# Patient Record
Sex: Female | Born: 1945 | Race: White | Hispanic: No | Marital: Married | Smoking: Never smoker
Health system: Southern US, Community
[De-identification: ages and names within clinical notes are randomized; demographics above are authoritative.]

## PROBLEM LIST (undated history)

## (undated) DIAGNOSIS — E119 Type 2 diabetes mellitus without complications: Secondary | ICD-10-CM

## (undated) DIAGNOSIS — J45909 Unspecified asthma, uncomplicated: Secondary | ICD-10-CM

## (undated) DIAGNOSIS — I10 Essential (primary) hypertension: Secondary | ICD-10-CM

## (undated) DIAGNOSIS — J449 Chronic obstructive pulmonary disease, unspecified: Secondary | ICD-10-CM

---

## 2010-07-17 HISTORY — PX: AORTIC VALVE REPLACEMENT (AVR)/CORONARY ARTERY BYPASS GRAFTING (CABG): SHX5725

## 2014-08-06 ENCOUNTER — Inpatient Hospital Stay (HOSPITAL_COMMUNITY)
Admission: EM | Admit: 2014-08-06 | Discharge: 2014-08-08 | DRG: 312 | Disposition: A | Discharge status: Home / Self Care | Payer: Medicare Other | Attending: Internal Medicine | Admitting: Internal Medicine

## 2014-08-06 ENCOUNTER — Emergency Department (HOSPITAL_COMMUNITY): Payer: Medicare Other

## 2014-08-06 ENCOUNTER — Encounter (HOSPITAL_COMMUNITY): Payer: Self-pay

## 2014-08-06 DIAGNOSIS — Z882 Allergy status to sulfonamides status: Secondary | ICD-10-CM | POA: Diagnosis not present

## 2014-08-06 DIAGNOSIS — Z953 Presence of xenogenic heart valve: Secondary | ICD-10-CM

## 2014-08-06 DIAGNOSIS — I10 Essential (primary) hypertension: Secondary | ICD-10-CM | POA: Diagnosis present

## 2014-08-06 DIAGNOSIS — R001 Bradycardia, unspecified: Secondary | ICD-10-CM | POA: Diagnosis present

## 2014-08-06 DIAGNOSIS — J449 Chronic obstructive pulmonary disease, unspecified: Secondary | ICD-10-CM | POA: Diagnosis present

## 2014-08-06 DIAGNOSIS — R079 Chest pain, unspecified: Secondary | ICD-10-CM

## 2014-08-06 DIAGNOSIS — Z951 Presence of aortocoronary bypass graft: Secondary | ICD-10-CM

## 2014-08-06 DIAGNOSIS — R7989 Other specified abnormal findings of blood chemistry: Secondary | ICD-10-CM | POA: Diagnosis present

## 2014-08-06 DIAGNOSIS — Z881 Allergy status to other antibiotic agents status: Secondary | ICD-10-CM | POA: Diagnosis not present

## 2014-08-06 DIAGNOSIS — J45909 Unspecified asthma, uncomplicated: Secondary | ICD-10-CM | POA: Diagnosis present

## 2014-08-06 DIAGNOSIS — E119 Type 2 diabetes mellitus without complications: Secondary | ICD-10-CM | POA: Diagnosis present

## 2014-08-06 DIAGNOSIS — E039 Hypothyroidism, unspecified: Secondary | ICD-10-CM | POA: Diagnosis present

## 2014-08-06 DIAGNOSIS — R55 Syncope and collapse: Secondary | ICD-10-CM | POA: Diagnosis present

## 2014-08-06 DIAGNOSIS — Z954 Presence of other heart-valve replacement: Secondary | ICD-10-CM | POA: Diagnosis not present

## 2014-08-06 DIAGNOSIS — Z952 Presence of prosthetic heart valve: Secondary | ICD-10-CM

## 2014-08-06 HISTORY — DX: Chronic obstructive pulmonary disease, unspecified: J44.9

## 2014-08-06 HISTORY — DX: Type 2 diabetes mellitus without complications: E11.9

## 2014-08-06 HISTORY — DX: Unspecified asthma, uncomplicated: J45.909

## 2014-08-06 HISTORY — DX: Essential (primary) hypertension: I10

## 2014-08-06 LAB — CBC WITH DIFFERENTIAL/PLATELET
BASOS ABS: 0 10*3/uL (ref 0.0–0.1)
Basophils Relative: 0 % (ref 0–1)
EOS PCT: 1 % (ref 0–5)
Eosinophils Absolute: 0.1 10*3/uL (ref 0.0–0.7)
HCT: 37.8 % (ref 36.0–46.0)
HEMOGLOBIN: 12.7 g/dL (ref 12.0–15.0)
LYMPHS ABS: 1.2 10*3/uL (ref 0.7–4.0)
Lymphocytes Relative: 20 % (ref 12–46)
MCH: 31.5 pg (ref 26.0–34.0)
MCHC: 33.6 g/dL (ref 30.0–36.0)
MCV: 93.8 fL (ref 78.0–100.0)
MONOS PCT: 8 % (ref 3–12)
Monocytes Absolute: 0.5 10*3/uL (ref 0.1–1.0)
NEUTROS ABS: 4.2 10*3/uL (ref 1.7–7.7)
Neutrophils Relative %: 71 % (ref 43–77)
Platelets: 132 10*3/uL — ABNORMAL LOW (ref 150–400)
RBC: 4.03 MIL/uL (ref 3.87–5.11)
RDW: 13.4 % (ref 11.5–15.5)
WBC: 5.9 10*3/uL (ref 4.0–10.5)

## 2014-08-06 LAB — BASIC METABOLIC PANEL
ANION GAP: 10 (ref 5–15)
BUN: 22 mg/dL (ref 6–23)
CALCIUM: 8.9 mg/dL (ref 8.4–10.5)
CHLORIDE: 108 meq/L (ref 96–112)
CO2: 23 mmol/L (ref 19–32)
CREATININE: 0.96 mg/dL (ref 0.50–1.10)
GFR calc non Af Amer: 60 mL/min — ABNORMAL LOW (ref >90)
GFR, EST AFRICAN AMERICAN: 69 mL/min — AB (ref >90)
GLUCOSE: 158 mg/dL — AB (ref 70–99)
Potassium: 4.3 mmol/L (ref 3.5–5.1)
Sodium: 141 mmol/L (ref 135–145)

## 2014-08-06 LAB — TROPONIN I: TROPONIN I: 0.04 ng/mL — AB (ref <0.031)

## 2014-08-06 MED ORDER — BUPROPION HCL ER (SR) 150 MG PO TB12
150.0000 mg | ORAL_TABLET | Freq: Every day | ORAL | Status: DC
  Administered 2014-08-07 – 2014-08-08 (×2): 150 mg via ORAL
  Filled 2014-08-06 (×3): qty 1

## 2014-08-06 MED ORDER — BUDESONIDE-FORMOTEROL FUMARATE 80-4.5 MCG/ACT IN AERO
2.0000 | INHALATION_SPRAY | Freq: Every day | RESPIRATORY_TRACT | Status: DC
  Administered 2014-08-07 – 2014-08-08 (×2): 2 via RESPIRATORY_TRACT
  Filled 2014-08-06: qty 6.9

## 2014-08-06 MED ORDER — LEVOTHYROXINE SODIUM 112 MCG PO TABS
112.0000 ug | ORAL_TABLET | Freq: Every day | ORAL | Status: DC
  Administered 2014-08-07 – 2014-08-08 (×2): 112 ug via ORAL
  Filled 2014-08-06 (×3): qty 1

## 2014-08-06 MED ORDER — SODIUM CHLORIDE 0.9 % IJ SOLN
3.0000 mL | Freq: Two times a day (BID) | INTRAMUSCULAR | Status: DC
  Administered 2014-08-06 – 2014-08-08 (×4): 3 mL via INTRAVENOUS

## 2014-08-06 MED ORDER — IPRATROPIUM-ALBUTEROL 0.5-2.5 (3) MG/3ML IN SOLN: 3.0000 mL | RESPIRATORY_TRACT | Status: DC | PRN

## 2014-08-06 MED ORDER — METFORMIN HCL 500 MG PO TABS
500.0000 mg | ORAL_TABLET | Freq: Two times a day (BID) | ORAL | Status: DC
  Administered 2014-08-07 (×2): 500 mg via ORAL
  Filled 2014-08-06 (×4): qty 1

## 2014-08-06 MED ORDER — SODIUM CHLORIDE 0.9 % IV SOLN
INTRAVENOUS | Status: DC
  Administered 2014-08-06 – 2014-08-07 (×3): via INTRAVENOUS

## 2014-08-06 MED ORDER — HEPARIN SODIUM (PORCINE) 5000 UNIT/ML IJ SOLN
5000.0000 [IU] | Freq: Three times a day (TID) | INTRAMUSCULAR | Status: DC
  Administered 2014-08-07 – 2014-08-08 (×5): 5000 [IU] via SUBCUTANEOUS
  Filled 2014-08-06 (×8): qty 1

## 2014-08-06 MED ORDER — POTASSIUM CHLORIDE CRYS ER 20 MEQ PO TBCR: 20.0000 meq | EXTENDED_RELEASE_TABLET | ORAL | Status: DC

## 2014-08-06 MED ORDER — FUROSEMIDE 40 MG PO TABS
40.0000 mg | ORAL_TABLET | Freq: Every day | ORAL | Status: DC
  Administered 2014-08-07 – 2014-08-08 (×2): 40 mg via ORAL
  Filled 2014-08-06 (×2): qty 1

## 2014-08-06 MED ORDER — MONTELUKAST SODIUM 10 MG PO TABS
10.0000 mg | ORAL_TABLET | Freq: Every day | ORAL | Status: DC
  Administered 2014-08-07 (×2): 10 mg via ORAL
  Filled 2014-08-06 (×3): qty 1

## 2014-08-06 MED ORDER — SIMVASTATIN 10 MG PO TABS
10.0000 mg | ORAL_TABLET | Freq: Every day | ORAL | Status: DC
  Administered 2014-08-07 (×2): 10 mg via ORAL
  Filled 2014-08-06 (×3): qty 1

## 2014-08-06 MED ORDER — PANTOPRAZOLE SODIUM 40 MG PO TBEC
40.0000 mg | DELAYED_RELEASE_TABLET | Freq: Every day | ORAL | Status: DC
  Administered 2014-08-07 – 2014-08-08 (×2): 40 mg via ORAL
  Filled 2014-08-06 (×2): qty 1

## 2014-08-06 MED ORDER — ASPIRIN 81 MG PO CHEW
324.0000 mg | CHEWABLE_TABLET | Freq: Once | ORAL | Status: AC
  Administered 2014-08-06: 243 mg via ORAL
  Filled 2014-08-06: qty 4

## 2014-08-06 NOTE — ED Provider Notes (Signed)
CSN: [id number]     Arrival date & time 08/06/14  1959 History   First MD Initiated Contact with Patient 08/06/14 2007     Chief Complaint  Patient presents with  . Weakness     (Consider location/radiation/quality/duration/timing/severity/associated sxs/prior Treatment) HPI Comments: Patient here with acute onset of becoming dizzy without dyspnea while riding in a car today. Symptoms are worse when standing up. After she moved her bowels she had sudden onset of substernal chest pain with associated diaphoresis. Patient had a near syncopal event. EMS was called and patient had a rhythm strip with a heart rate of 34. Patient is also hypertensive. Was given IV fluids 400 cc and she feels better at this time. CBG was 188. Denies any recent bloody stools or abdominal pain. Patient took her normal dose of her beta blocker about 4:00 today. She missed her dose yesterday.  Patient is a 69 y.o. female presenting with weakness. The history is provided by the patient and a relative.  Weakness    Past Medical History  Diagnosis Date  . Hypertension   . Asthma   . COPD (chronic obstructive pulmonary disease)   . Diabetes mellitus without complication    No past surgical history on file. No family history on file. History  Substance Use Topics  . Smoking status: Not on file  . Smokeless tobacco: Not on file  . Alcohol Use: Not on file   OB History    No data available     Review of Systems  Neurological: Positive for weakness.  All other systems reviewed and are negative.     Allergies  Biaxin; Demerol; and Sulfa antibiotics  Home Medications   Prior to Admission medications   Not on File   There were no vitals taken for this visit. Physical Exam  Constitutional: She is oriented to person, place, and time. She appears well-developed and well-nourished.  Non-toxic appearance. No distress.  HENT:  Head: Normocephalic and atraumatic.  Eyes: Conjunctivae, EOM and lids are  normal. Pupils are equal, round, and reactive to light.  Neck: Normal range of motion. Neck supple. No tracheal deviation present. No thyroid mass present.  Cardiovascular: Normal rate, regular rhythm and normal heart sounds.  Exam reveals no gallop.   No murmur heard. Pulmonary/Chest: Effort normal and breath sounds normal. No stridor. No respiratory distress. She has no decreased breath sounds. She has no wheezes. She has no rhonchi. She has no rales.  Abdominal: Soft. Normal appearance and bowel sounds are normal. She exhibits no distension. There is no tenderness. There is no rebound and no CVA tenderness.  Musculoskeletal: Normal range of motion. She exhibits no edema or tenderness.  Neurological: She is alert and oriented to person, place, and time. She has normal strength. No cranial nerve deficit or sensory deficit. GCS eye subscore is 4. GCS verbal subscore is 5. GCS motor subscore is 6.  Skin: Skin is warm and dry. No abrasion and no rash noted.  Psychiatric: She has a normal mood and affect. Her speech is normal and behavior is normal.  Nursing note and vitals reviewed.   ED Course  Procedures (including critical care time) Labs Review Labs Reviewed  TROPONIN I  CBC WITH DIFFERENTIAL  BASIC METABOLIC PANEL    Imaging Review No results found.   EKG Interpretation   Date/Time:  Thursday August 06 2014 20:10:39 EST Ventricular Rate:  63 PR Interval:  240 QRS Duration: 109 QT Interval:  401 QTC Calculation: 410 R Axis:   -  28 Text Interpretation:  Sinus rhythm Atrial premature complex Prolonged PR  interval Borderline left axis deviation Borderline repolarization  abnormality Confirmed by Freida BusmanALLEN  MD, Wilder Amodei ([id number]) on 08/06/2014 8:26:51  PM      MDM   Final diagnoses:  Chest pain   Patient given aspirin on arrival here. She has no chest pain currently at this time. EKG was repeated and she continues to have some ST depression in her inferior leads. Will consult  cardiology due to slightly elevated troponin.   Consult to internal medicine medicine for admission. Also made     Toy BakerAnthony T Tomasa Dobransky, MD 08/06/14 2240

## 2014-08-06 NOTE — ED Notes (Signed)
Pt remains monitored by blood pressure, pulse ox, and 12 lead. pts family remains at bedside.  

## 2014-08-06 NOTE — ED Notes (Signed)
Pt noticed weakness and dizziness throughout the day while riding in the car. Pt does not have hx of irregular heartbeat. Pt has had intermittent chest pain throughout the day with no aggravating/relieving factors.

## 2014-08-06 NOTE — H&P (Signed)
[facility] History and Physical  Denise Gray WUJ:[id number] DOB: [birth year] DOA: 08/06/2014  Referring physician: EDP PCP: No primary care provider on file.   Chief Complaint: Syncope / Presyncope   HPI: Denise Gray is a 69 y.o. female with history of aortic stenosis s/p AVR 4 years ago with porcine valve, single vessel CABG.  Patient presents to ED after an episode of dizziness, lightheadedness and presyncope while in a car today.  Patient has been having episodes of known and documented tachycardia associated with presyncope recently for which her cardiologist was planning on putting her on a halter monitor.  Her HR has been documented as fast in the office previously.  This morning she felt ill and felt like her heart was racing, then going slow, then racing again.  She took her normal dose of beta blocker at 4pm today (missed dose yesterday), and then presyncopal episode onset later in the car this evening.  EMS was called, the patient was noted to be very bradycardic (rhythm strip is at bedside documenting heart rate of 34) lowest reported by EMS was 23 (no strip available for the 23).  Review of Systems: Systems reviewed.  As above, otherwise negative  Past Medical History  Diagnosis Date  . Hypertension   . Asthma   . COPD (chronic obstructive pulmonary disease)   . Diabetes mellitus without complication    Past Surgical History  Procedure Laterality Date  . Aortic valve replacement (avr)/coronary artery bypass grafting (cabg)  2012    AVR and single vessel CABG   Social History:  reports that she has never smoked. She has never used smokeless tobacco. Her alcohol and drug histories are not on file.  Allergies  Allergen Reactions  . Demerol [Meperidine] Nausea And Vomiting  . Biaxin [Clarithromycin] Hives  . Sulfa Antibiotics Hives    History reviewed. No pertinent family history.   Prior to Admission medications   Medication Sig Start Date End Date  Taking? Authorizing Provider  budesonide-formoterol (SYMBICORT) 80-4.5 MCG/ACT inhaler Inhale 2 puffs into the lungs daily.   Yes Historical Provider, MD  buPROPion (WELLBUTRIN SR) 150 MG 12 hr tablet Take 150 mg by mouth daily. 07/01/14  Yes Historical Provider, MD  cyanocobalamin (,VITAMIN B-12,) 1000 MCG/ML injection Inject 2,000 mcg into the muscle every 30 (thirty) days. 07/27/14  Yes Historical Provider, MD  furosemide (LASIX) 40 MG tablet Take 40 mg by mouth daily. 05/24/14  Yes Historical Provider, MD  ipratropium-albuterol (DUONEB) 0.5-2.5 (3) MG/3ML SOLN Inhale 3 mLs into the lungs every 4 (four) hours as needed (for shortness of breath).  07/06/14  Yes Historical Provider, MD  KLOR-CON M20 20 MEQ tablet Take 20 mEq by mouth 2 (two) times a week. Takes on Tues and thurs 06/20/14  Yes Historical Provider, MD  levothyroxine (SYNTHROID, LEVOTHROID) 112 MCG tablet Take 112 mcg by mouth daily. 05/24/14  Yes Historical Provider, MD  metFORMIN (GLUCOPHAGE) 500 MG tablet Take 500 mg by mouth 2 (two) times daily. 05/16/14  Yes Historical Provider, MD  montelukast (SINGULAIR) 10 MG tablet Take 10 mg by mouth at bedtime. 05/04/08  Yes Historical Provider, MD  omalizumab Geoffry Paradise) 150 MG injection Inject into the skin every 14 (fourteen) days.   Yes Historical Provider, MD  pantoprazole (PROTONIX) 40 MG tablet Take 40 mg by mouth daily. 05/01/14  Yes Historical Provider, MD  simvastatin (ZOCOR) 10 MG tablet Take 10 mg by mouth at bedtime. 06/03/14  Yes Historical Provider, MD  Vitamin D, Ergocalciferol, (DRISDOL) 50000 UNITS  CAPS capsule Take 50,000 Units by mouth every Wednesday.   Yes Historical Provider, MD   Physical Exam: Filed Vitals:   08/06/14 2203  BP: 138/67  Pulse: 58  Temp:   Resp: 15    BP 138/67 mmHg  Pulse 58  Temp(Src) 97.6 F (36.4 C)  Resp 15  SpO2 100%  General Appearance:    Alert, oriented, no distress, appears stated age  Head:    Normocephalic, atraumatic  Eyes:     PERRL, EOMI, sclera non-icteric        Nose:   Nares without drainage or epistaxis. Mucosa, turbinates normal  Throat:   Moist mucous membranes. Oropharynx without erythema or exudate.  Neck:   Supple. No carotid bruits.  No thyromegaly.  No lymphadenopathy.   Back:     No CVA tenderness, no spinal tenderness  Lungs:     Clear to auscultation bilaterally, without wheezes, rhonchi or rales  Chest wall:    No tenderness to palpitation  Heart:    Regular rate and rhythm without murmurs, gallops, rubs  Abdomen:     Soft, non-tender, nondistended, normal bowel sounds, no organomegaly  Genitalia:    deferred  Rectal:    deferred  Extremities:   No clubbing, cyanosis or edema.  Pulses:   2+ and symmetric all extremities  Skin:   Skin color, texture, turgor normal, no rashes or lesions  Lymph nodes:   Cervical, supraclavicular, and axillary nodes normal  Neurologic:   CNII-XII intact. Normal strength, sensation and reflexes      throughout    Labs on Admission:  Basic Metabolic Panel:  Recent Labs Lab 08/06/14 2057  NA 141  K 4.3  CL 108  CO2 23  GLUCOSE 158*  BUN 22  CREATININE 0.96  CALCIUM 8.9   Liver Function Tests: No results for input(s): AST, ALT, ALKPHOS, BILITOT, PROT, ALBUMIN in the last 168 hours. No results for input(s): LIPASE, AMYLASE in the last 168 hours. No results for input(s): AMMONIA in the last 168 hours. CBC:  Recent Labs Lab 08/06/14 2057  WBC 5.9  NEUTROABS 4.2  HGB 12.7  HCT 37.8  MCV 93.8  PLT 132*   Cardiac Enzymes:  Recent Labs Lab 08/06/14 2057  TROPONINI 0.04*    BNP (last 3 results) No results for input(s): PROBNP in the last 8760 hours. CBG: No results for input(s): GLUCAP in the last 168 hours.  Radiological Exams on Admission: Dg Chest 2 View  08/06/2014   CLINICAL DATA:  Chest pain for 3 days  EXAM: CHEST  2 VIEW  COMPARISON:  None.  FINDINGS: There is airspace opacity in the lower lung on the lateral view not well  appreciated on the AP view. There is no other focal parenchymal opacity. There is no pleural effusion or pneumothorax. There is cardiomegaly. There is evidence of prior CABG.  The osseous structures are unremarkable.  IMPRESSION: Airspace opacity in the lower lung on the lateral view not well seen on the AP view. This may reflect pneumonia versus atelectasis versus hiatal hernia.   Electronically Signed   By: Elige KoHetal  Patel   On: 08/06/2014 20:56    EKG: Independently reviewed.  Assessment/Plan Principal Problem:   Syncope, cardiogenic Active Problems:   Bradycardia, sinus   1. 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 Cards consult called by EDP and pending, ? Need halter monitor or more evidence before making determination regarding pacemaker  need? 2. Tele monitor 3. Stopping beta blocker 4. 2d echo ordered given the history of AVR. 5. Serial trops ordered    Code Status: Full Code  Family Communication: Family at bedside Disposition Plan: Admit to inpatient   Time spent: 70 min  GARDNER, JARED M. [facility] Pager [phone]  If 7AM-7PM, please contact the day team taking care of the patient [email or web address] Password [facility] 08/06/2014, 11:23 PM

## 2014-08-06 NOTE — ED Notes (Signed)
Per EMS - Pt has been traveling and [place], she began to feel lightheaded/dizzy/nauseated, and that heart was going "fast and then slow and then fast again." No SOB, a&o x4 but somewhat lethargic en route w/ lowest pulse of 23, initial palp 100. Given 400ml of LR and pt states that she felt more awake upon arrival to ED, HR up to 70. Pt didn't take meds last night and took morning meds today at 1pm. No radials felt initially. Sinus arrhythmia w/ multiple PACs and pauses en route. CBG 188, 98% on RA. Hx of aortic valve replacement, thyroid removed, one bypass. Pt hasn't taken lasix in 3 days.

## 2014-08-07 ENCOUNTER — Encounter (HOSPITAL_COMMUNITY): Payer: Self-pay

## 2014-08-07 DIAGNOSIS — R55 Syncope and collapse: Secondary | ICD-10-CM

## 2014-08-07 DIAGNOSIS — Z951 Presence of aortocoronary bypass graft: Secondary | ICD-10-CM

## 2014-08-07 LAB — GLUCOSE, CAPILLARY
GLUCOSE-CAPILLARY: 210 mg/dL — AB (ref 70–99)
Glucose-Capillary: 90 mg/dL (ref 70–99)
Glucose-Capillary: 150 mg/dL — ABNORMAL HIGH (ref 70–99)

## 2014-08-07 LAB — TROPONIN I
TROPONIN I: 0.04 ng/mL — AB (ref <0.031)
TROPONIN I: 0.04 ng/mL — AB (ref <0.031)
Troponin I: 0.03 ng/mL (ref <0.031)

## 2014-08-07 MED ORDER — STUDY - INVESTIGATIONAL DRUG SIMPLE RECORD
0.2000 ug/kg/min | Status: DC
  Filled 2014-08-07: qty 25

## 2014-08-07 MED ORDER — POTASSIUM CHLORIDE CRYS ER 20 MEQ PO TBCR: 20.0000 meq | EXTENDED_RELEASE_TABLET | ORAL | Status: DC

## 2014-08-07 MED ORDER — STUDY - INVESTIGATIONAL DRUG SIMPLE RECORD
0.1000 ug/kg/min | Status: DC
  Filled 2014-08-07: qty 25

## 2014-08-07 MED ORDER — INSULIN ASPART 100 UNIT/ML ~~LOC~~ SOLN
0.0000 [IU] | Freq: Three times a day (TID) | SUBCUTANEOUS | Status: DC
Start: 2014-08-07 — End: 2014-08-08
  Administered 2014-08-08: 2 [IU] via SUBCUTANEOUS

## 2014-08-07 NOTE — Progress Notes (Signed)
[facility] PROGRESS NOTE  Denise SaltsBarbara Gray WGN:[id number] DOB: 08-22-1945 DOA: 08/06/2014 PCP: No primary care provider on file. Interim summary: Denise SaltsBarbara Gray is a 69 y.o. female with history of aortic stenosis s/p AVR 4 years ago with porcine valve, single vessel CABG. Patient presents to ED after an episode of dizziness, lightheadedness and presyncope while sitting in the car. She reports recurrent symptoms in the last few days. She was supposed to get an event monitor put in by her cardiologist at [place], but she had to travel to [place], as her brother died.  Assessment/Plan: Presyncope: Admitted to telemetry, evaluating for arrhythmias, pauses and blocks. Cardiology consulted and recommendations given.  Echocardiogram ordered. b b blocker stopped. Serial troponins.    Diabetes Mellitus: Metformin on hold while inpatient, SSI . Hgba1c ordered.    Asthma/ COPD: - Stable.   Hypothyroidism: Resume synthroid.    Code Status: full code Family Communication: none at bedside Disposition Plan: pending   Consultants:  cardiology  Procedures:  Echocardiogram.   Antibiotics:  none  HPI/Subjective: Wants to know when she can go home.   Objective: Filed Vitals:   08/07/14 1300  BP: 138/88  Pulse: 63  Temp: 97.8 F (36.6 C)  Resp: 20    Intake/Output Summary (Last 24 hours) at 08/07/14 1610 Last data filed at 08/07/14 1524  Gross per 24 hour  Intake 2686.25 ml  Output   1800 ml  Net 886.25 ml   Filed Weights   08/07/14 0450  Weight: 91.9 kg (202 lb 9.6 oz)    Exam:   General:  Alert afebrile comfortable  Cardiovascular: s1s2  Respiratory: ctab  Abdomen: soft non tender non distended bowelsounds heard  Musculoskeletal: pedal edema 1+  Data Reviewed: Basic Metabolic Panel:  Recent Labs Lab 08/06/14 2057  NA 141  K 4.3  CL 108  CO2 23  GLUCOSE 158*  BUN 22  CREATININE 0.96  CALCIUM 8.9   Liver Function Tests: No results for  input(s): AST, ALT, ALKPHOS, BILITOT, PROT, ALBUMIN in the last 168 hours. No results for input(s): LIPASE, AMYLASE in the last 168 hours. No results for input(s): AMMONIA in the last 168 hours. CBC:  Recent Labs Lab 08/06/14 2057  WBC 5.9  NEUTROABS 4.2  HGB 12.7  HCT 37.8  MCV 93.8  PLT 132*   Cardiac Enzymes:  Recent Labs Lab 08/06/14 2057 08/06/14 2320 08/07/14 0435 08/07/14 1058  TROPONINI 0.04* 0.04* 0.03 0.04*   BNP (last 3 results) No results for input(s): PROBNP in the last 8760 hours. CBG:  Recent Labs Lab 08/07/14 0058  GLUCAP 210*    No results found for this or any previous visit (from the past 240 hour(s)).   Studies: Dg Chest 2 View  08/06/2014   CLINICAL DATA:  Chest pain for 3 days  EXAM: CHEST  2 VIEW  COMPARISON:  None.  FINDINGS: There is airspace opacity in the lower lung on the lateral view not well appreciated on the AP view. There is no other focal parenchymal opacity. There is no pleural effusion or pneumothorax. There is cardiomegaly. There is evidence of prior CABG.  The osseous structures are unremarkable.  IMPRESSION: Airspace opacity in the lower lung on the lateral view not well seen on the AP view. This may reflect pneumonia versus atelectasis versus hiatal hernia.   Electronically Signed   By: Elige KoHetal  Patel   On: 08/06/2014 20:56    Scheduled Meds: . budesonide-formoterol  2 puff Inhalation Daily  . buPROPion  150 mg Oral Q breakfast  . furosemide  40 mg Oral Daily  . heparin  5,000 Units Subcutaneous 3 times per day  . levothyroxine  112 mcg Oral QAC breakfast  . metFORMIN  500 mg Oral BID  . montelukast  10 mg Oral QHS  . pantoprazole  40 mg Oral Daily  . [START ON 08/11/2014] potassium chloride SA  20 mEq Oral Once per day on Tue Thu  . simvastatin  10 mg Oral QHS  . sodium chloride  3 mL Intravenous Q12H   Continuous Infusions: . sodium chloride 125 mL/hr at 08/07/14 1351    Principal Problem:   Vasovagal  near-syncope Active Problems:   Bradycardia, sinus   S/P CABG (coronary artery bypass graft)    Time spent: 25 min which includes 50% of the time with face-to-face with patient/ family and coordinating care related to the above assessment and plan.    [facility]  [facility] Pager [phone] If 7PM-7AM, please contact night-coverage at [email or web address], password [facility] 08/07/2014, 4:10 PM  LOS: 1 day

## 2014-08-07 NOTE — Progress Notes (Signed)
Utilization review completed.  

## 2014-08-07 NOTE — Consult Note (Signed)
CARDIOLOGY CONSULT NOTE   Denise Gray MRN: [id number] DOB/AGE: August 24, 1945 69 y.o. Admit date: 08/06/2014  Referring Physician:  Dr. Julian Reil Primary Cardiologist: None  Reason for consultation:  Presyncope/syncope  HPI:  Denise Gray is a 69 y.o. female with history of aortic stenosis s/p AVR 4 years ago with porcine valve and single vessel CABG, who presented to ED after an episode of dizziness, lightheadedness and presyncope while in a car today. Patient apparently has history of tachyarrhythmia on Metoprolol and was considered for event monitor. However, today it was her brother's funeral. She has been very emotional throughout the day, and forgot to take her medications including Metoprolol in the AM. Later she had symptoms of nausea, feeling ear ringing and dizziness, before she ask her husband to call 911. Per report, there was documented bradycardia to 20's but no tracing available. To be noted, patient did later take her Metoprolol at about 1 pm. During the whole process, she endorsed that she had one short episode of chest pain that went away quickly.  On my interview, she is sleeping comfortably without any symptoms.   Review of systems: A review of 10 organ systems was done and is negative except as stated above in HPI  Past Medical History  Diagnosis Date  . Hypertension   . Asthma   . COPD (chronic obstructive pulmonary disease)   . Diabetes mellitus without complication    Past Surgical History  Procedure Laterality Date  . Aortic valve replacement (avr)/coronary artery bypass grafting (cabg)  2012    AVR and single vessel CABG   History   Social History  . Marital Status: Married    Spouse Name: N/A    Number of Children: N/A  . Years of Education: N/A   Occupational History  . Not on file.   Social History Main Topics  . Smoking status: Never Smoker   . Smokeless tobacco: Never Used  . Alcohol Use: Yes     Comment: "once in awhile"  . Drug Use:  Not on file  . Sexual Activity: Not on file   Other Topics Concern  . Not on file   Social History Narrative    History reviewed. No pertinent family history.   Allergies  Allergen Reactions  . Demerol [Meperidine] Nausea And Vomiting  . Biaxin [Clarithromycin] Hives  . Sulfa Antibiotics Hives    Prescriptions prior to admission  Medication Sig Dispense Refill Last Dose  . budesonide-formoterol (SYMBICORT) 80-4.5 MCG/ACT inhaler Inhale 2 puffs into the lungs daily.   08/06/2014 at Unknown time  . buPROPion (WELLBUTRIN SR) 150 MG 12 hr tablet Take 150 mg by mouth daily.  0 08/06/2014 at Unknown time  . cyanocobalamin (,VITAMIN B-12,) 1000 MCG/ML injection Inject 2,000 mcg into the muscle every 30 (thirty) days.  0 Past Week at Unknown time  . furosemide (LASIX) 40 MG tablet Take 40 mg by mouth daily.  2 08/06/2014 at Unknown time  . ipratropium-albuterol (DUONEB) 0.5-2.5 (3) MG/3ML SOLN Inhale 3 mLs into the lungs every 4 (four) hours as needed (for shortness of breath).   0 not used  . KLOR-CON M20 20 MEQ tablet Take 20 mEq by mouth 2 (two) times a week. Takes on Tues and thurs  3 08/06/2014 at Unknown time  . levothyroxine (SYNTHROID, LEVOTHROID) 112 MCG tablet Take 112 mcg by mouth daily.  1 08/06/2014 at Unknown time  . metFORMIN (GLUCOPHAGE) 500 MG tablet Take 500 mg by mouth 2 (two) times daily.  2 08/06/2014 at Unknown time  . montelukast (SINGULAIR) 10 MG tablet Take 10 mg by mouth at bedtime.   08/05/2014 at Unknown time  . omalizumab (XOLAIR) 150 MG injection Inject into the skin every 14 (fourteen) days.   07/23/14 at Unknown time  . pantoprazole (PROTONIX) 40 MG tablet Take 40 mg by mouth daily.  1 08/06/2014 at Unknown time  . simvastatin (ZOCOR) 10 MG tablet Take 10 mg by mouth at bedtime.  1 08/05/2014 at Unknown time  . Vitamin D, Ergocalciferol, (DRISDOL) 50000 UNITS CAPS capsule Take 50,000 Units by mouth every Wednesday.   08/05/2014 at Unknown time   Current  Facility-Administered Medications  Medication Dose Route Frequency Provider Last Rate Last Dose  . 0.9 %  sodium chloride infusion   Intravenous Continuous Toy BakerAnthony T Allen, MD 125 mL/hr at 08/07/14 0051    . budesonide-formoterol (SYMBICORT) 80-4.5 MCG/ACT inhaler 2 puff  2 puff Inhalation Daily Hillary BowJared M Gardner, DO      . buPROPion [facility](WELLBUTRIN SR) 12 hr tablet 150 mg  150 mg Oral Q breakfast Jared M Gardner, DO      . furosemide (LASIX) tablet 40 mg  40 mg Oral Daily Hillary BowJared M Gardner, DO      . heparin injection 5,000 Units  5,000 Units Subcutaneous 3 times per day Hillary BowJared M Gardner, DO      . ipratropium-albuterol (DUONEB) 0.5-2.5 (3) MG/3ML nebulizer solution 3 mL  3 mL Inhalation Q4H PRN Hillary BowJared M Gardner, DO      . levothyroxine (SYNTHROID, LEVOTHROID) tablet 112 mcg  112 mcg Oral QAC breakfast Hillary BowJared M Gardner, DO      . metFORMIN (GLUCOPHAGE) tablet 500 mg  500 mg Oral BID Hillary BowJared M Gardner, DO      . montelukast (SINGULAIR) tablet 10 mg  10 mg Oral QHS Jared M Gardner, DO      . pantoprazole (PROTONIX) EC tablet 40 mg  40 mg Oral Daily Hillary BowJared M Gardner, DO      . [START ON 08/11/2014] potassium chloride SA (K-DUR,KLOR-CON) CR tablet 20 mEq  20 mEq Oral Once per day on Tue Thu Jared M Gardner, DO      . simvastatin (ZOCOR) tablet 10 mg  10 mg Oral QHS Hillary BowJared M Gardner, DO      . sodium chloride 0.9 % injection 3 mL  3 mL Intravenous Q12H Hillary BowJared M Gardner, DO        Physical Exam: Blood pressure 149/72, pulse 85, temperature 97.5 F (36.4 C), temperature source Oral, resp. rate 20, SpO2 96 %.; There is no height or weight on file to calculate BMI. Temp:  [97.5 F (36.4 C)-97.6 F (36.4 C)] 97.5 F (36.4 C) (01/22 0026) Pulse Rate:  [58-85] 85 (01/22 0026) Resp:  [14-21] 20 (01/22 0026) BP: (133-152)/(67-102) 149/72 mmHg (01/22 0026) SpO2:  [96 %-100 %] 96 % (01/22 0026)  No intake or output data in the 24 hours ending 08/07/14 0053 General: NAD Heent: MMM Neck: No JVD  CV: Nondisplaced PMI.   RRR, nl S1/S2, no S3/S4, no murmur. No carotid bruit   Lungs: Clear to auscultation bilaterally with normal respiratory effort Abdomen: Soft, nontender, nondistended Extremities: No clubbing or cyanosis.  Normal pedal pulses. No pedal edema Skin: Intact without lesions or rashes  Neurologic: Alert and oriented x 3, grossly nonfocal  Psych: Normal mood and affect    Labs:  Recent Labs  08/06/14 2057 08/06/14 2320  TROPONINI 0.04* 0.04*   Lab Results  Component  Value Date   WBC 5.9 08/06/2014   HGB 12.7 08/06/2014   HCT 37.8 08/06/2014   MCV 93.8 08/06/2014   PLT 132* 08/06/2014    Recent Labs Lab 08/06/14 2057  NA 141  K 4.3  CL 108  CO2 23  BUN 22  CREATININE 0.96  CALCIUM 8.9  GLUCOSE 158*   No results found for: CHOL, HDL, LDLCALC, TRIG   EKG:  Sinus with ST depression in lateral leads Tele: Reviewed. No bradycardia or pauses.  Echo:  Pending Radiology:  Dg Chest 2 View  08/06/2014   CLINICAL DATA:  Chest pain for 3 days  EXAM: CHEST  2 VIEW  COMPARISON:  None.  FINDINGS: There is airspace opacity in the lower lung on the lateral view not well appreciated on the AP view. There is no other focal parenchymal opacity. There is no pleural effusion or pneumothorax. There is cardiomegaly. There is evidence of prior CABG.  The osseous structures are unremarkable.  IMPRESSION: Airspace opacity in the lower lung on the lateral view not well seen on the AP view. This may reflect pneumonia versus atelectasis versus hiatal hernia.   Electronically Signed   By: Elige Ko   On: 08/06/2014 20:56    ASSESSMENT:   69 y.o. female with history of aortic stenosis s/p AVR 4 years ago with porcine valve and single vessel CABG, who presented to ED after an episode of dizziness, lightheadedness and presyncope. The presenting symptoms resemble vasovagal syncope, however, due to her cardiac history, need to rule out ischemia and high grade AVB.   PLAN:  1. Continue cycling troponin to  rule out ischemia 2. HOld any AV Block agents, including BB 2. Continue telemetry 3. On tele, If high grade AVB or pronounced pauses, consider PPM 4. On tele, If no other abnormality on Tele, would place a Ziopatch or ACT elite, for ambulatory evaluation and discharge home with close follow up with primary cardiologist.   Thank you for this consultation.  Will continue to follow.  Signed: Haydee Salter, MD Cardiology Fellow 08/07/2014, 12:53 AM

## 2014-08-07 NOTE — Progress Notes (Signed)
*  PRELIMINARY RESULTS* Echocardiogram 2D Echocardiogram has been performed.  Freda Jaquith 08/07/2014, 11:17 AM 

## 2014-08-07 NOTE — Progress Notes (Signed)
Pt arrived to unit via stretcher.  CCMD notified, VSS, call bell within reach.  Will continue to monitor.  Erenest Rasheraldwell,Kamran Coker B, RN

## 2014-08-07 NOTE — Progress Notes (Signed)
*  PRELIMINARY RESULTS* Echocardiogram 2D Echocardiogram has been performed.  Jeryl ColumbiaLLIOTT, Milda Lindvall 08/07/2014, 11:17 AM

## 2014-08-07 NOTE — Progress Notes (Signed)
Subjective: No dizziness, CP or SOB  Objective: Vital signs in last 24 hours: Temp:  [97.5 F (36.4 C)-97.7 F (36.5 C)] 97.5 F (36.4 C) (01/22 0830) Pulse Rate:  [58-85] 62 (01/22 0830) Resp:  [14-21] 18 (01/22 0830) BP: (122-152)/(67-102) 122/71 mmHg (01/22 0830) SpO2:  [96 %-100 %] 100 % (01/22 0830) Weight:  [202 lb 9.6 oz (91.9 kg)] 202 lb 9.6 oz (91.9 kg) (01/22 0450) Last BM Date: 08/07/14  Intake/Output from previous day:   Intake/Output this shift: Total I/O In: 240 [P.O.:240] Out: -   Medications Current Facility-Administered Medications  Medication Dose Route Frequency Provider Last Rate Last Dose  . 0.9 %  sodium chloride infusion   Intravenous Continuous Toy Baker, MD 125 mL/hr at 08/07/14 0051    . budesonide-formoterol (SYMBICORT) 80-4.5 MCG/ACT inhaler 2 puff  2 puff Inhalation Daily Hillary Bow, DO   2 puff at 08/07/14 [phone]  . buPROPion [facility] SR) 12 hr tablet 150 mg  150 mg Oral Q breakfast Hillary Bow, DO   150 mg at 08/07/14 4782  . furosemide (LASIX) tablet 40 mg  40 mg Oral Daily Hillary Bow, DO   40 mg at 08/07/14 9562  . heparin injection 5,000 Units  5,000 Units Subcutaneous 3 times per day Hillary Bow, DO   5,000 Units at 08/07/14 1037  . ipratropium-albuterol (DUONEB) 0.5-2.5 (3) MG/3ML nebulizer solution 3 mL  3 mL Inhalation Q4H PRN Hillary Bow, DO      . levothyroxine (SYNTHROID, LEVOTHROID) tablet 112 mcg  112 mcg Oral QAC breakfast Hillary Bow, DO   112 mcg at 08/07/14 1308  . metFORMIN (GLUCOPHAGE) tablet 500 mg  500 mg Oral BID Hillary Bow, DO   500 mg at 08/07/14 0955  . montelukast (SINGULAIR) tablet 10 mg  10 mg Oral QHS Hillary Bow, DO   10 mg at 08/07/14 0118  . pantoprazole (PROTONIX) EC tablet 40 mg  40 mg Oral Daily Hillary Bow, DO   40 mg at 08/07/14 0955  . [START ON 08/11/2014] potassium chloride SA (K-DUR,KLOR-CON) CR tablet 20 mEq  20 mEq Oral Once per day on Tue Thu Jared M  Gardner, DO      . simvastatin (ZOCOR) tablet 10 mg  10 mg Oral QHS Hillary Bow, DO   10 mg at 08/07/14 0118  . sodium chloride 0.9 % injection 3 mL  3 mL Intravenous Q12H Hillary Bow, DO   3 mL at 08/07/14 0957    PE: General appearance: alert, cooperative and no distress Lungs: clear to auscultation bilaterally Heart: regular rate and rhythm and 1/6 sys MM Extremities: 1+ ankle edema Pulses: 2+ and symmetric Skin: Warm and dry Neurologic: Grossly normal  Lab Results:   Recent Labs  08/06/14 2057  WBC 5.9  HGB 12.7  HCT 37.8  PLT 132*   BMET  Recent Labs  08/06/14 2057  NA 141  K 4.3  CL 108  CO2 23  GLUCOSE 158*  BUN 22  CREATININE 0.96  CALCIUM 8.9    Assessment/Plan  69 y.o. female with history of aortic stenosis s/p AVR 4 years ago with porcine valve and single vessel CABG, who presented to ED after an episode of dizziness, lightheadedness and presyncope while in a car  Principal Problem:   Syncope, cardiogenic Feeling better.  No tachy arrhythmias on tele.  A lot of artifact and PACs.  Some bradycardia to 40's.  Apparently  EMS had a tele strip with HR in the 20's.  Not on any BB now but was on metoprolol which she missed the the last two evening doses.  She reports having a mild, dull ache in her chest then nausea/vomiting.  Troponin 0.04 x 2 then 0.03.  Echo completed.  Awaiting read.  I think she needs a nuclear stress test before being discharged.  OP tele monitoring as well, if nothing shows up here.  BP controlled.       CABGx1, 2012   AVR  2012    Bradycardia, sinus    LOS: 1 day    Zulema Pulaski PA-C 08/07/2014 11:01 AM

## 2014-08-08 DIAGNOSIS — Z954 Presence of other heart-valve replacement: Secondary | ICD-10-CM

## 2014-08-08 LAB — GLUCOSE, CAPILLARY
Glucose-Capillary: 144 mg/dL — ABNORMAL HIGH (ref 70–99)
Glucose-Capillary: 151 mg/dL — ABNORMAL HIGH (ref 70–99)

## 2014-08-08 LAB — HEMOGLOBIN A1C
Hgb A1c MFr Bld: 7.3 % — ABNORMAL HIGH (ref <5.7)
MEAN PLASMA GLUCOSE: 163 mg/dL — AB (ref <117)

## 2014-08-08 NOTE — Progress Notes (Signed)
Primary cardiologist: Dr. Collier Bullock [facility])  Seen for followup: Near syncope  Subjective:    No recurrent dizziness or syncope. No chest pain or palpitations.  Objective:   Temp:  [97.7 F (36.5 C)-97.8 F (36.6 C)] 97.8 F (36.6 C) (01/23 0431) Pulse Rate:  [62-72] 71 (01/23 0938) Resp:  [16-20] 16 (01/23 0431) BP: (120-172)/(72-88) 120/72 mmHg (01/23 0938) SpO2:  [96 %-98 %] 96 % (01/23 0431) Last BM Date: 08/07/14  Filed Weights   08/07/14 0450  Weight: 202 lb 9.6 oz (91.9 kg)    Intake/Output Summary (Last 24 hours) at 08/08/14 1037 Last data filed at 08/08/14 1000  Gross per 24 hour  Intake 1288.33 ml  Output   2700 ml  Net -1411.67 ml    Telemetry: Sinus rhythm. Intermittently noted nonconducted PACs, PVCs, no pauses. Brief episode of SVT.  Exam:  General: Appears comfortable at rest.  Lungs: Clear, nonlabored.  Cardiac: RRR with 2/6 systolic murmur at the base, no gallop.  Abdomen: NABS.  Extremities: No edema.   Lab Results:  Basic Metabolic Panel:  Recent Labs Lab 08/06/14 2057  NA 141  K 4.3  CL 108  CO2 23  GLUCOSE 158*  BUN 22  CREATININE 0.96  CALCIUM 8.9    CBC:  Recent Labs Lab 08/06/14 2057  WBC 5.9  HGB 12.7  HCT 37.8  MCV 93.8  PLT 132*    Cardiac Enzymes:  Recent Labs Lab 08/06/14 2320 08/07/14 0435 08/07/14 1058  TROPONINI 0.04* 0.03 0.04*    Echocardiogram: Study Conclusions  - Left ventricle: The cavity size was normal. There was mild concentric hypertrophy. Systolic function was normal. The estimated ejection fraction was in the range of 55% to 60%. Wall motion was normal; there were no regional wall motion abnormalities. Doppler parameters are consistent with abnormal left ventricular relaxation (grade 1 diastolic dysfunction). - Aortic valve: A bioprosthesis was present. Transvalvular velocity was increased, due to high cardiac output. There was  mild stenosis. - Mitral valve: There was mild regurgitation. - Left atrium: The atrium was moderately to severely dilated. - Right atrium: The atrium was mildly dilated. - Tricuspid valve: There was moderate regurgitation. - Pulmonary arteries: Systolic pressure was moderately increased. PA peak pressure: 46 mm Hg (S).   Medications:   Scheduled Medications: . budesonide-formoterol  2 puff Inhalation Daily  . buPROPion  150 mg Oral Q breakfast  . furosemide  40 mg Oral Daily  . heparin  5,000 Units Subcutaneous 3 times per day  . insulin aspart  0-9 Units Subcutaneous TID WC  . levothyroxine  112 mcg Oral QAC breakfast  . montelukast  10 mg Oral QHS  . pantoprazole  40 mg Oral Daily  . [START ON 08/11/2014] potassium chloride SA  20 mEq Oral Once per day on Tue Thu  . simvastatin  10 mg Oral QHS  . sodium chloride  3 mL Intravenous Q12H      PRN Medications:  ipratropium-albuterol   Assessment:   1. Episodic near syncope. Patient reports episodes that are fairly sudden in onset without precipitant, can occur when she is standing or seated. Not associated with chest pain or definite sense of palpitations. She tells me parenthetically that when she was exercising at her cardiac maintenance program, she was asked to stop because her heart rate got up to "186" on one occasion. Telemetry does not show any definite event that would explain near syncope, she has not been symptomatic while on telemetry.  Question is whether she is having sustained SVT associated with symptoms, or could she perhaps be having a bradyarrhythmia that has not yet been fully detected.  2. Status post bioprosthetic AVR 4 years ago with single-vessel CABG. She is followed by Dr. Collier BullockMichael Valentine [place].  3. Elevated troponin, only up to 0.04, and in flat pattern. This is not consistent with ACS. She has had no recent chest pain. Ischemic workup is not needed at this  time.   Plan/Discussion:    Discussed with patient, husband, and daughter today. Symptoms sound potentially arrhythmogenic, although not yet clear if tachy or brady related. Beta blocker has been stopped for now with some documented bradycardia (although not symptomatic at the time). Echocardiogram shows normal LVEF with mild diastolic dysfunction, bioprosthetic AV with mildly increased gradients (would not explain symptoms). The minor flat troponin I elevation (0.04) is not consistent with ACS, and ischemic testing is not clearly indicated at this time. Recommend that she go ahead with further OP cardiac monitoring (30 day monitor most likely) which had already been considered by her primary cardiologist in [place] by patient report. No further inpatient cardiac testing planned now.  If discharged today, would recommend getting cardiac monitor from her cardiologist Dr. Collier BullockMichael Valentine prior to travelling back to [place]. They voiced comfort with this plan.  Jonelle SidleSamuel G. Sherion Dooly, M.D., F.A.C.C.

## 2014-08-08 NOTE — Progress Notes (Signed)
Nursing note Pt given discharge instructions, medication list, and all questions answered will discharge home as ordered. Bay Wayson, Randall AnKristin Jessup RN

## 2014-08-08 NOTE — Discharge Summary (Signed)
Physician Discharge Summary  Denise Gray ZOX:[id number] DOB: Jan 31, 1946 DOA: 08/06/2014  PCP: No primary care provider on file.  Admit date: 08/06/2014 Discharge date: 08/08/2014  Time spent: 28 minutes  Recommendations for Outpatient Follow-up:  1. FOLLOW UP WITH YOUR cardiologist in [place] and get event monitor before travel.   Discharge Diagnoses:  Principal Problem:   Vasovagal near-syncope Active Problems:   Bradycardia, sinus   S/P CABG (coronary artery bypass graft)   Discharge Condition: improved  Diet recommendation: regular diet.   Filed Weights   08/07/14 0450  Weight: 91.9 kg (202 lb 9.6 oz)    History of present illness/ Hospital Course: :  Denise SaltsBarbara Gray is a 69 y.o. female with history of aortic stenosis s/p AVR 4 years ago with porcine valve, single vessel CABG. Patient presents to ED after an episode of dizziness, lightheadedness and presyncope while sitting in the car. She reports recurrent symptoms in the last few days. She was supposed to get an event monitor put in by her cardiologist at [place], but she had to travel to [place], as her brother died. She underwent echocardiogram which showed good LVEF. Tele monitoring revealed some bradycardia without pauses and blocks. cardiolgoy recommended outpatient follow up with her cardiologist at [place] and an event monitor to be put in before travel to [place]. b blockers were held on discharge. Resume the rest of her home medications.      Procedures:  Echocardiogram.   Consultations:  cardiology  Discharge Exam: Filed Vitals:   08/08/14 0938  BP: 120/72  Pulse: 71  Temp:   Resp:     General: alert afebrile comfortable Cardiovascular: s1s2 Respiratory: ctab  Discharge Instructions   Discharge Instructions    Diet - low sodium heart healthy    Complete by:  As directed      Discharge instructions    Complete by:  As directed   [facility] [place] with [facility]ologist [place] [facility] and get an  event monitor placed. And no driving until she get evaluated by her cardiologist .  Follow up with PCP in 2 weeks.          Current Discharge Medication List    CONTINUE these medications which have NOT CHANGED   Details  budesonide-formoterol (SYMBICORT) 80-4.5 MCG/ACT inhaler Inhale 2 puffs into the lungs daily.    buPROPion (WELLBUTRIN SR) 150 MG 12 hr tablet Take 150 mg by mouth daily. Refills: 0    cyanocobalamin (,VITAMIN B-12,) 1000 MCG/ML injection Inject 2,000 mcg into the muscle every 30 (thirty) days. Refills: 0    furosemide (LASIX) 40 MG tablet Take 40 mg by mouth daily. Refills: 2    ipratropium-albuterol (DUONEB) 0.5-2.5 (3) MG/3ML SOLN Inhale 3 mLs into the lungs every 4 (four) hours as needed (for shortness of breath).  Refills: 0    KLOR-CON M20 20 MEQ tablet Take 20 mEq by mouth 2 (two) times a week. Takes on Tues and thurs Refills: 3    levothyroxine (SYNTHROID, LEVOTHROID) 112 MCG tablet Take 112 mcg by mouth daily. Refills: 1    metFORMIN (GLUCOPHAGE) 500 MG tablet Take 500 mg by mouth 2 (two) times daily. Refills: 2    montelukast (SINGULAIR) 10 MG tablet Take 10 mg by mouth at bedtime.    omalizumab (XOLAIR) 150 MG injection Inject into the skin every 14 (fourteen) days.    pantoprazole (PROTONIX) 40 MG tablet Take 40 mg by mouth daily. Refills: 1    simvastatin (ZOCOR) 10 MG tablet Take 10 mg by  mouth at bedtime. Refills: 1    Vitamin D, Ergocalciferol, (DRISDOL) 50000 UNITS CAPS capsule Take 50,000 Units by mouth every Wednesday.       Allergies  Allergen Reactions  . Demerol [Meperidine] Nausea And Vomiting  . Biaxin [Clarithromycin] Hives  . Sulfa Antibiotics Hives      The results of significant diagnostics from this hospitalization (including imaging, microbiology, ancillary and laboratory) are listed below for reference.    Significant Diagnostic Studies: Dg Chest 2 View  08/06/2014   CLINICAL DATA:  Chest pain for 3 days   EXAM: CHEST  2 VIEW  COMPARISON:  None.  FINDINGS: There is airspace opacity in the lower lung on the lateral view not well appreciated on the AP view. There is no other focal parenchymal opacity. There is no pleural effusion or pneumothorax. There is cardiomegaly. There is evidence of prior CABG.  The osseous structures are unremarkable.  IMPRESSION: Airspace opacity in the lower lung on the lateral view not well seen on the AP view. This may reflect pneumonia versus atelectasis versus hiatal hernia.   Electronically Signed   By: Elige Ko   On: 08/06/2014 20:56    Microbiology: No results found for this or any previous visit (from the past 240 hour(s)).   Labs: Basic Metabolic Panel:  Recent Labs Lab 08/06/14 2057  NA 141  K 4.3  CL 108  CO2 23  GLUCOSE 158*  BUN 22  CREATININE 0.96  CALCIUM 8.9   Liver Function Tests: No results for input(s): AST, ALT, ALKPHOS, BILITOT, PROT, ALBUMIN in the last 168 hours. No results for input(s): LIPASE, AMYLASE in the last 168 hours. No results for input(s): AMMONIA in the last 168 hours. CBC:  Recent Labs Lab 08/06/14 2057  WBC 5.9  NEUTROABS 4.2  HGB 12.7  HCT 37.8  MCV 93.8  PLT 132*   Cardiac Enzymes:  Recent Labs Lab 08/06/14 2057 08/06/14 2320 08/07/14 0435 08/07/14 1058  TROPONINI 0.04* 0.04* 0.03 0.04*   BNP: BNP (last 3 results) No results for input(s): PROBNP in the last 8760 hours. CBG:  Recent Labs Lab 08/07/14 0058 08/07/14 1616 08/07/14 2117 08/08/14 0602 08/08/14 1154  GLUCAP 210* 90 150* 144* 151*       Signed:  Kellyn Mccary  [facility] 08/08/2014, 1:37 PM

## 2016-07-29 IMAGING — CR DG CHEST 2V
2 series · 2 of 2 positions shown · non-contrast
Comparison: None.

CLINICAL DATA: Chest pain for 3 days

EXAM:
CHEST  2 VIEW

[w chest lat]
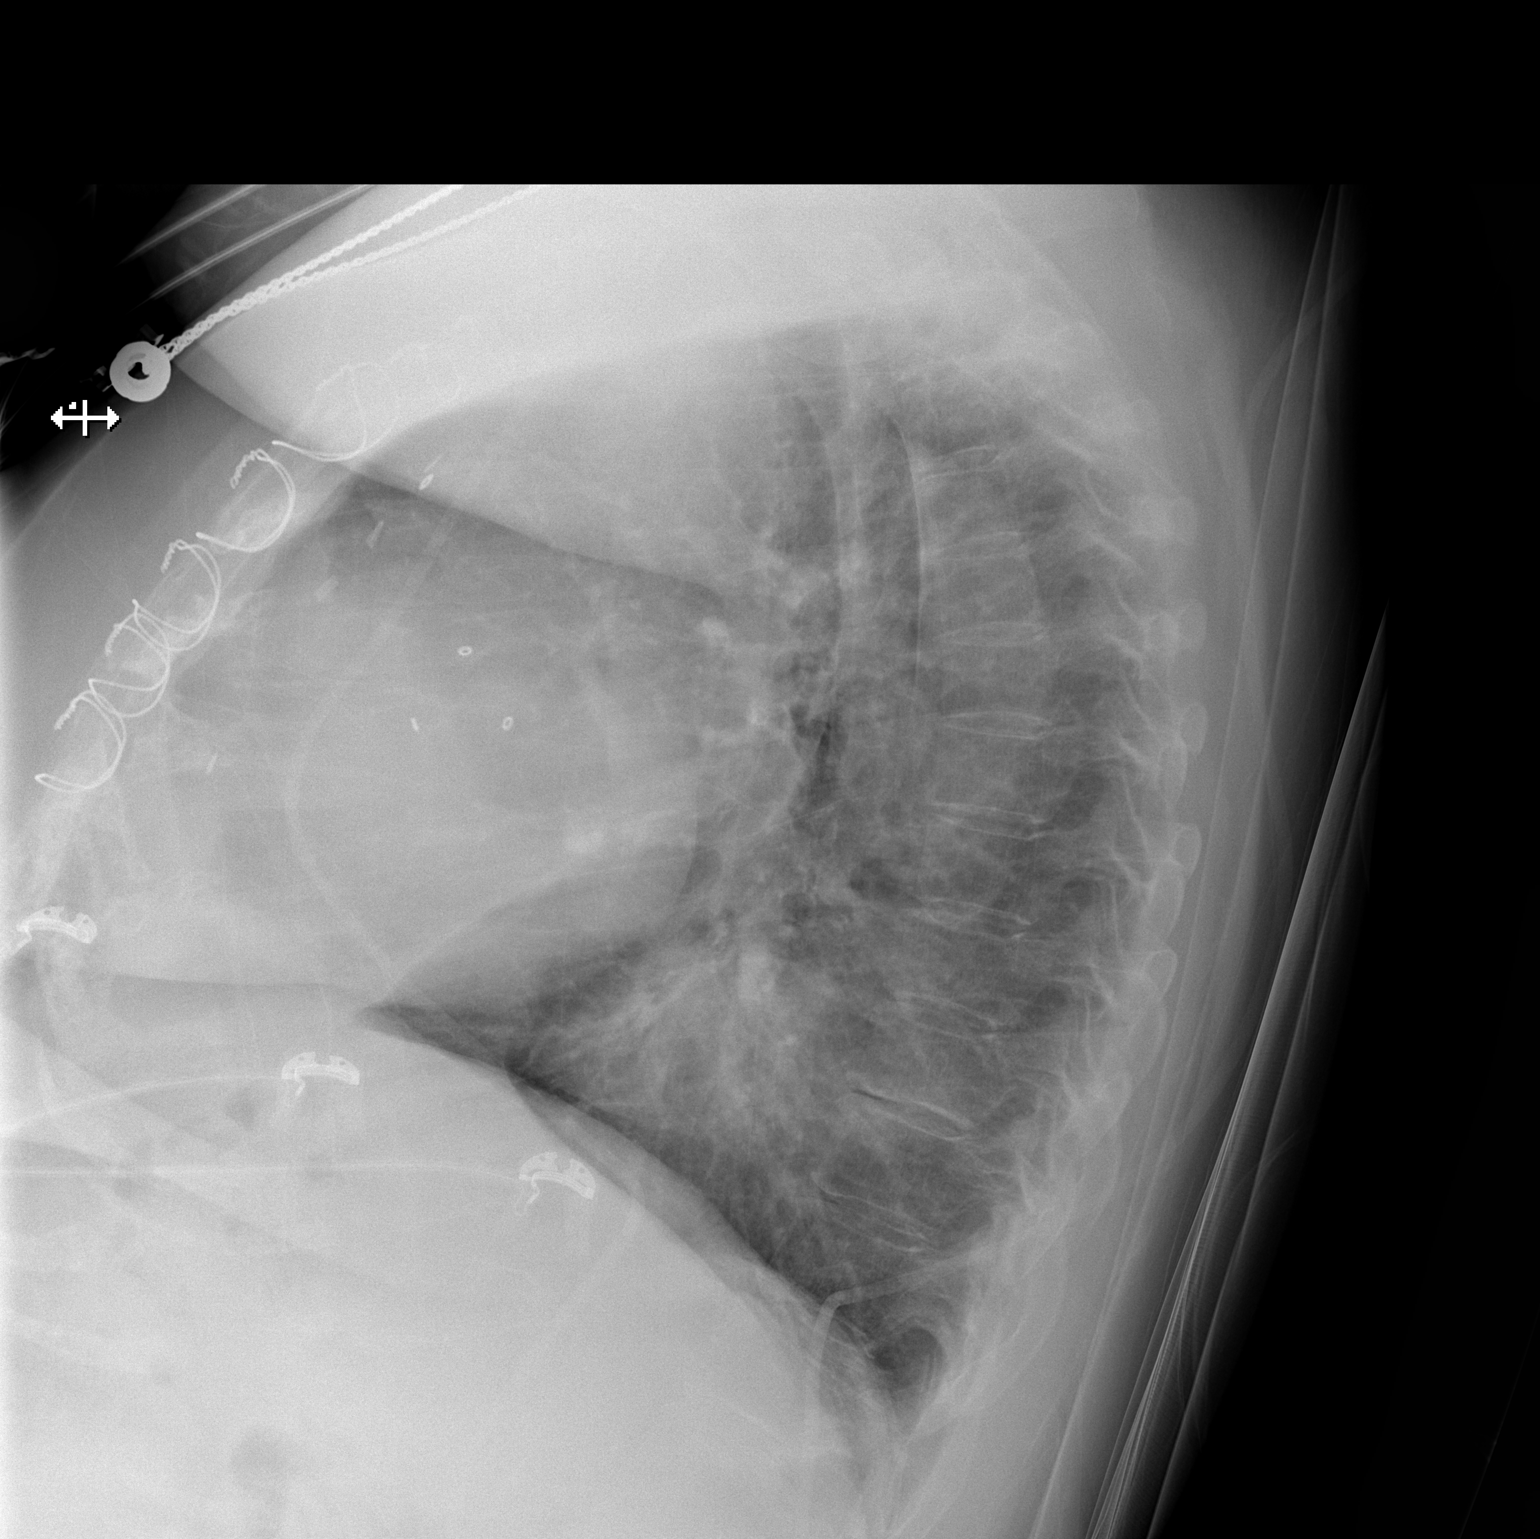

[x chest ap]
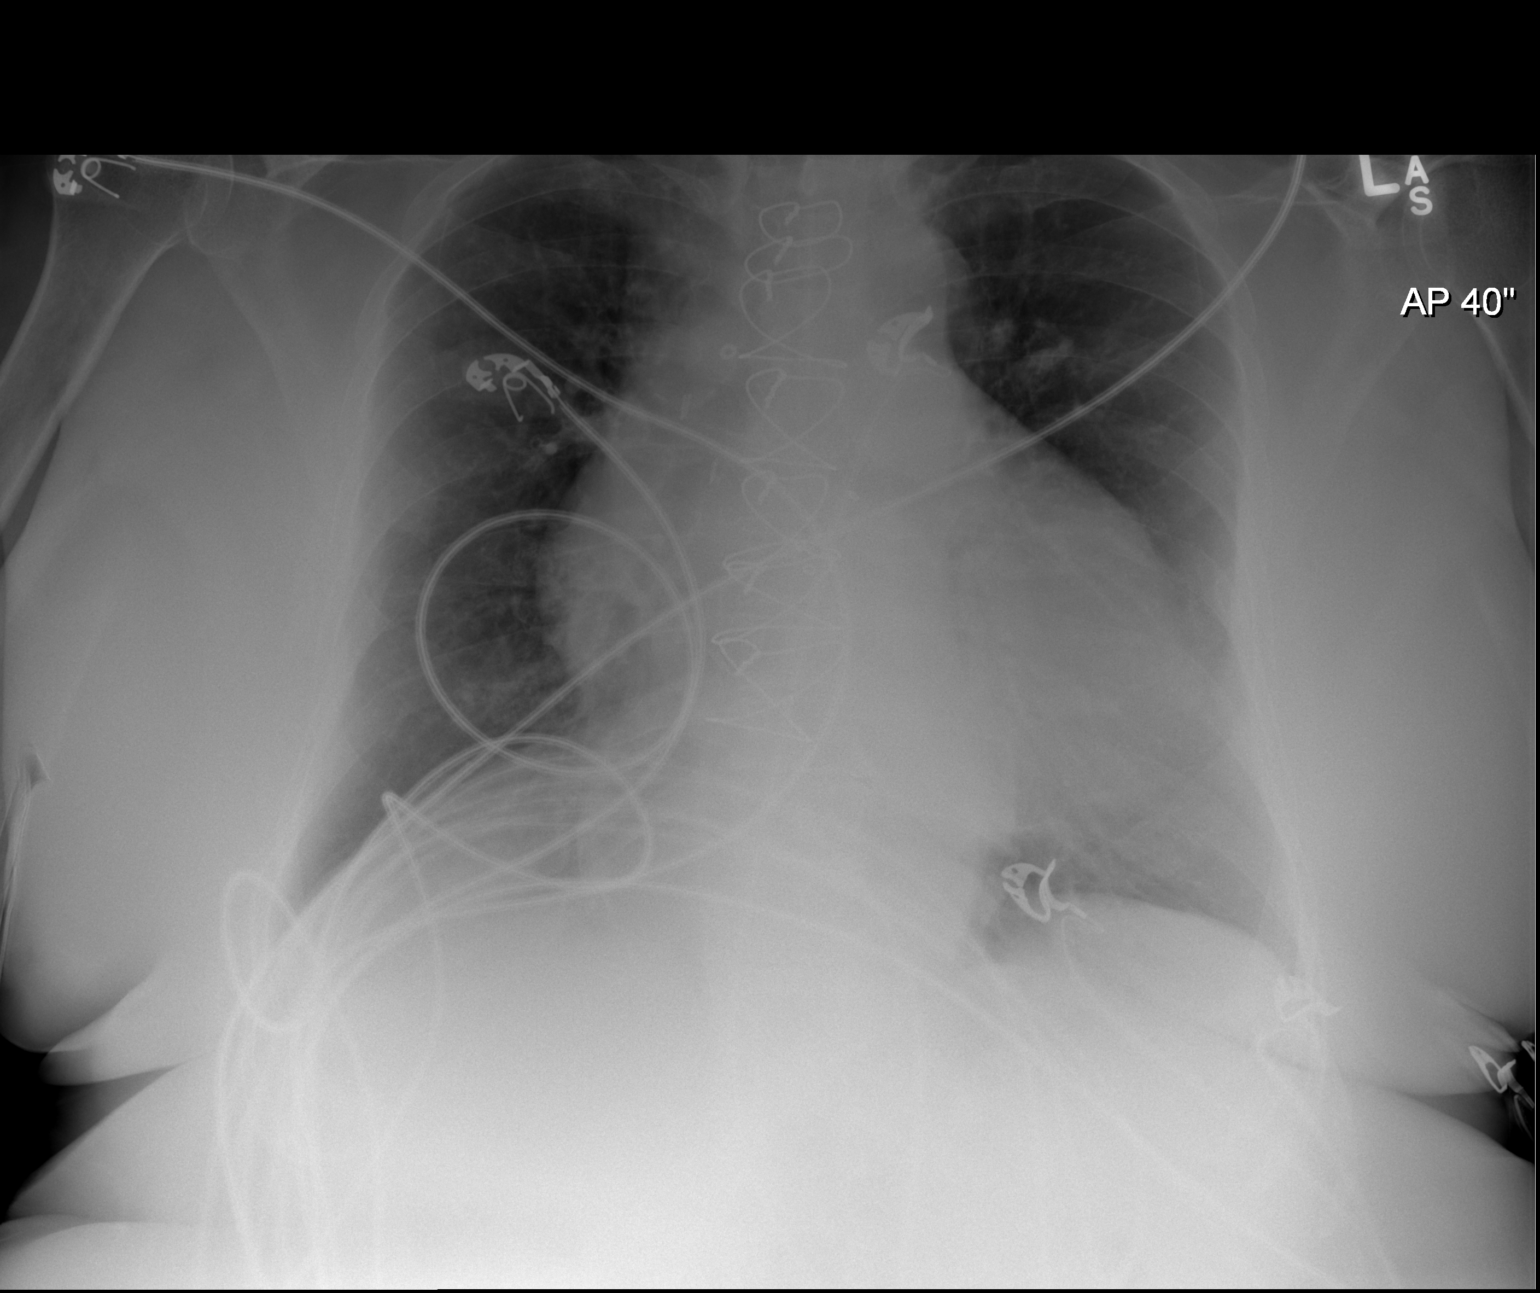

[2 of 2 positions shown; findings below may reference images not displayed]

FINDINGS: There is airspace opacity in the lower lung on the lateral view not
well appreciated on the AP view. There is no other focal parenchymal
opacity. There is no pleural effusion or pneumothorax. There is
cardiomegaly. There is evidence of prior CABG.

The osseous structures are unremarkable.
IMPRESSION: Airspace opacity in the lower lung on the lateral view not well seen
on the AP view. This may reflect pneumonia versus atelectasis versus
hiatal hernia.

## 2017-08-08 ENCOUNTER — Other Ambulatory Visit: Payer: Self-pay | Admitting: Radiology

## 2020-03-17 DEATH — deceased
